# Patient Record
Sex: Female | Born: 1985 | Race: White | Hispanic: No | Smoking: Current every day smoker
Health system: Southern US, Community
[De-identification: ages and names within clinical notes are randomized; demographics above are authoritative.]

## PROBLEM LIST (undated history)

## (undated) HISTORY — PX: TUBAL LIGATION: SHX77

## (undated) HISTORY — PX: MULTIPLE TOOTH EXTRACTIONS: SHX2053

---

## 2013-08-16 DIAGNOSIS — O159 Eclampsia, unspecified as to time period: Secondary | ICD-10-CM

## 2013-08-16 HISTORY — DX: Eclampsia, unspecified as to time period: O15.9

## 2017-08-29 ENCOUNTER — Other Ambulatory Visit: Payer: Self-pay | Admitting: Pain Medicine

## 2017-08-29 DIAGNOSIS — M542 Cervicalgia: Secondary | ICD-10-CM

## 2017-09-05 ENCOUNTER — Other Ambulatory Visit: Payer: Self-pay

## 2017-09-07 ENCOUNTER — Ambulatory Visit
Admission: RE | Admit: 2017-09-07 | Discharge: 2017-09-07 | Disposition: A | Discharge status: Home / Self Care | Payer: Medicare Other | Source: Ambulatory Visit | Attending: Pain Medicine | Admitting: Pain Medicine

## 2017-09-07 DIAGNOSIS — M542 Cervicalgia: Secondary | ICD-10-CM

## 2019-12-01 ENCOUNTER — Other Ambulatory Visit: Payer: Self-pay | Admitting: Physician Assistant

## 2019-12-01 DIAGNOSIS — M4722 Other spondylosis with radiculopathy, cervical region: Secondary | ICD-10-CM

## 2020-02-13 ENCOUNTER — Other Ambulatory Visit: Payer: Medicare Other

## 2020-03-06 ENCOUNTER — Other Ambulatory Visit: Payer: Medicare Other

## 2020-09-29 ENCOUNTER — Ambulatory Visit: Payer: Medicare Other | Attending: Neurosurgery | Admitting: Physical Therapy

## 2020-10-14 ENCOUNTER — Other Ambulatory Visit: Payer: Self-pay | Admitting: Neurosurgery

## 2020-11-03 NOTE — Progress Notes (Signed)
Surgical Instructions    Your procedure is scheduled on May 24  Report to [facility] Main Entrance "A" at 0530 A.M., then check in with the Admitting office.  Call this number if you have problems the morning of surgery:  [phone]   If you have any questions prior to your surgery date call [phone]: Open Monday-Friday 8am-4pm    Remember:  Do not eat or drink after midnight the night before your surgery    Take these medicines the morning of surgery with A SIP OF WATER  albuterol (VENTOLIN HFA)  If needed, Please bring all inhalers with you the day of surgery.  gabapentin (NEURONTIN)  mirtazapine (REMERON) oxyCODONE (OXY IR/ROXICODONE) if needed  venlafaxine XR (EFFEXOR-XR)   As of today, STOP taking any Aspirin (unless otherwise instructed by your surgeon) Aleve, Naproxen, Ibuprofen, Motrin, Advil, Goody's, BC's, all herbal medications, fish oil, and all vitamins.                     Do not wear jewelry, make up, or nail polish            Do not wear lotions, powders, perfumes/colognes, or deodorant.            Do not shave 48 hours prior to surgery.             Do not bring valuables to the hospital.            [facility] is not responsible for any belongings or valuables.  Do NOT Smoke (Tobacco/Vaping) or drink Alcohol 24 hours prior to your procedure If you use a CPAP at night, you may bring all equipment for your overnight stay.   Contacts, glasses, dentures or bridgework may not be worn into surgery, please bring cases for these belongings   For patients admitted to the hospital, discharge time will be determined by your treatment team.   Patients discharged the day of surgery will not be allowed to drive home, and someone needs to stay with them for 24 hours.    Special instructions:    Oral Hygiene is also important to reduce your risk of infection.  Remember - BRUSH YOUR TEETH THE MORNING OF SURGERY WITH YOUR REGULAR TOOTHPASTE   [place]- Preparing  For Surgery  Before surgery, you can play an important role. Because skin is not sterile, your skin needs to be as free of germs as possible. You can reduce the number of germs on your skin by washing with CHG (chlorahexidine gluconate) Soap before surgery.  CHG is an antiseptic cleaner which kills germs and bonds with the skin to continue killing germs even after washing.     Please do not use if you have an allergy to CHG or antibacterial soaps. If your skin becomes reddened/irritated stop using the CHG.  Do not shave (including legs and underarms) for at least 48 hours prior to first CHG shower. It is OK to shave your face.  Please follow these instructions carefully.    1.  Shower the NIGHT BEFORE SURGERY and the MORNING OF SURGERY with CHG Soap.   If you chose to wash your hair, wash your hair first as usual with your normal shampoo. After you shampoo, rinse your hair and body thoroughly to remove the shampoo.  Then [employer] and genitals (private parts) with your normal soap and rinse thoroughly to remove soap.  2. After that Use CHG Soap as you would any other liquid soap. You  can apply CHG directly to the skin and wash gently with a scrungie or a clean washcloth.   3. Apply the CHG Soap to your body ONLY FROM THE NECK DOWN.  Do not use on open wounds or open sores. Avoid contact with your eyes, ears, mouth and genitals (private parts). Wash Face and genitals (private parts)  with your normal soap.   4. Wash thoroughly, paying special attention to the area where your surgery will be performed.  5. Thoroughly rinse your body with warm water from the neck down.  6. DO NOT shower/wash with your normal soap after using and rinsing off the CHG Soap.  7. Pat yourself dry with a CLEAN TOWEL.  8. Wear CLEAN PAJAMAS to bed the night before surgery  9. Place CLEAN SHEETS on your bed the night before your surgery  10. DO NOT SLEEP WITH PETS.   Day of Surgery: Take a shower with CHG  soap. Wear Clean/Comfortable clothing the morning of surgery Do not apply any deodorants/lotions.   Remember to brush your teeth WITH YOUR REGULAR TOOTHPASTE.   Please read over the following fact sheets that you were given.

## 2020-11-04 ENCOUNTER — Encounter (HOSPITAL_COMMUNITY): Payer: Self-pay

## 2020-11-04 ENCOUNTER — Other Ambulatory Visit: Payer: Self-pay

## 2020-11-04 ENCOUNTER — Encounter (HOSPITAL_COMMUNITY)
Admission: RE | Admit: 2020-11-04 | Discharge: 2020-11-04 | Disposition: A | Discharge status: Home / Self Care | Payer: Medicare Other | Source: Ambulatory Visit | Attending: Neurosurgery | Admitting: Neurosurgery

## 2020-11-04 DIAGNOSIS — Z01812 Encounter for preprocedural laboratory examination: Secondary | ICD-10-CM | POA: Diagnosis not present

## 2020-11-04 DIAGNOSIS — Z79899 Other long term (current) drug therapy: Secondary | ICD-10-CM | POA: Diagnosis not present

## 2020-11-04 DIAGNOSIS — M5412 Radiculopathy, cervical region: Secondary | ICD-10-CM | POA: Diagnosis not present

## 2020-11-04 DIAGNOSIS — Z8616 Personal history of COVID-19: Secondary | ICD-10-CM | POA: Insufficient documentation

## 2020-11-04 DIAGNOSIS — Z20822 Contact with and (suspected) exposure to covid-19: Secondary | ICD-10-CM | POA: Diagnosis not present

## 2020-11-04 LAB — CBC
HCT: 37.8 % (ref 36.0–46.0)
Hemoglobin: 12.6 g/dL (ref 12.0–15.0)
MCH: 30.9 pg (ref 26.0–34.0)
MCHC: 33.3 g/dL (ref 30.0–36.0)
MCV: 92.6 fL (ref 80.0–100.0)
Platelets: 346 10*3/uL (ref 150–400)
RBC: 4.08 MIL/uL (ref 3.87–5.11)
RDW: 13.5 % (ref 11.5–15.5)
WBC: 10.2 10*3/uL (ref 4.0–10.5)
nRBC: 0 % (ref 0.0–0.2)

## 2020-11-04 LAB — SURGICAL PCR SCREEN
MRSA, PCR: NEGATIVE
Staphylococcus aureus: POSITIVE — AB

## 2020-11-04 LAB — SARS CORONAVIRUS 2 (TAT 6-24 HRS): SARS Coronavirus 2: NEGATIVE

## 2020-11-04 NOTE — Progress Notes (Signed)
PCP - [facility] Cardiologist - Dr Virgil [facility]   Chest x-ray - denies EKG - denies Stress Test - denies ECHO - denies Cardiac Cath - denies  COVID TEST- pending   Anesthesia review: yes records requested [facility]  Patient denies shortness of breath, fever, cough and chest pain at PAT appointment   All instructions explained to the patient, with a verbal understanding of the material. Patient agrees to go over the instructions while at home for a better understanding. Patient also instructed to self quarantine after being tested for COVID-19. The opportunity to ask questions was provided.

## 2020-11-07 ENCOUNTER — Encounter (HOSPITAL_COMMUNITY): Payer: Self-pay

## 2020-11-07 NOTE — Anesthesia Preprocedure Evaluation (Addendum)
Anesthesia Evaluation  Patient identified by MRN, date of birth, ID band Patient awake    Reviewed: Allergy & Precautions, NPO status , Patient's Chart, lab work & pertinent test results  History of Anesthesia Complications Negative for: history of anesthetic complications  Airway Mallampati: II  TM Distance: >3 FB Neck ROM: Full    Dental  (+) Missing, Poor Dentition, Dental Advisory Given   Pulmonary Current Smoker,    Pulmonary exam normal        Cardiovascular negative cardio ROS Normal cardiovascular exam     Neuro/Psych negative neurological ROS     GI/Hepatic negative GI ROS, Neg liver ROS,   Endo/Other  negative endocrine ROS  Renal/GU negative Renal ROS  negative genitourinary   Musculoskeletal negative musculoskeletal ROS (+)   Abdominal   Peds  Hematology negative hematology ROS (+)   Anesthesia Other Findings   Reproductive/Obstetrics negative OB ROS                           Anesthesia Physical Anesthesia Plan  ASA: II  Anesthesia Plan: General   Post-op Pain Management:    Induction: Intravenous  PONV Risk Score and Plan: 2 and Ondansetron, Dexamethasone, Treatment may vary due to age or medical condition and Midazolam  Airway Management Planned: Oral ETT  Additional Equipment: None  Intra-op Plan:   Post-operative Plan: Extubation in OR  Informed Consent: I have reviewed the patients History and Physical, chart, labs and discussed the procedure including the risks, benefits and alternatives for the proposed anesthesia with the patient or authorized representative who has indicated his/her understanding and acceptance.     Dental advisory given  Plan Discussed with:   Anesthesia Plan Comments: (PAT note written 11/07/2020 by Shonna Chock, PA-C. )       Anesthesia Quick Evaluation

## 2020-11-07 NOTE — Progress Notes (Signed)
Anesthesia Chart Review:  Case: [id number] Date/Time: 11/08/20 0715   Procedure: Artificial Disc Replacement-Cervical - C5-C6 (N/A ) - 3C   Anesthesia type: General   Pre-op diagnosis: Radiculopathy cervical region   Location: [facility] OR ROOM 20 / [facility] OR   Surgeons: Bedelia Person, MD      DISCUSSION: Patient is a 35 year old female scheduled for the above procedure.  History includes smoking, tubal ligation, teeth extractions. COVID-19 05/03/20 (by notes).   She was evaluated by cardiologist Dr. Martha Clan in later 2021 for chest pain. He also notes that during her next to last pregnancy she had "a fairly complex course developing preeclampsia, seizures and a stroke.  She was also noted to have had ongoing bouts of bradycardia." (Review of [facility] Everywhere Discharge Summary from 08/21/13 indicated she had postpartum eclampsia and PRES/posterior reversible encephalopathy syndrome, so according to 01/17/15 Discharge Summary she was admitted for seizure prophylaxis with MgSO4 IV given piror history of eclampsia and noted to have bradycardia with HR 37 and post-partum pre-eclampsia, cardiology consulted and "thought to be reflex in response to HTN" with unremarkable EKG and Echo). She had a reassuring stress test and echo in 01/2020. He also repeated an echo in 05/2020 post-COVID--EF normal, no significant valvular abnormalities, although AV not well visualized. Last visit 12/8/21wtih six month follow-up planned.  11/04/20 preoperative COVID-19 test negative. Anesthesia team to evaluate on the day of surgery.    VS: BP 109/76   Pulse 71   Temp 36.9 C (Oral)   Resp 20   Ht 5\' 2"  (1.575 m)   Wt 61 kg   LMP 10/26/2020   SpO2 100%   BMI 24.58 kg/m     PROVIDERS: 12/26/2020, AGNP is PCP [facility]) [facility], MD is cardiologist [facility])   LABS: Labs reviewed: Acceptable for surgery. (all labs ordered are listed, but only abnormal  results are displayed)  Labs Reviewed  SURGICAL PCR SCREEN - Abnormal; Notable for the following components:      Result Value   Staphylococcus aureus POSITIVE (*)    All other components within normal limits  SARS CORONAVIRUS 2 (TAT 6-24 HRS)  CBC    EKG: 05/18/20 [facility]): Sinus rhythm   CV: Echo 05/25/20 [facility]): Conclusions: 1.  Sinus rhythm. 2.  This was a technically difficult study with suboptimal views. 3.  The left ventricular wall thickness is normal. 4.  Overall left ventricular systolic function is normal with an EF between 60 and 65%. 5.  The diastolic filling pattern is normal for age of the patient. 6.  The right atrium is normal size and function. 7.  The aortic valve is not well visualized. A.  Normal appearing mitral valve with normal valve function. 9.  The tricuspid valve appears structurally normal with normal function. 10.  Trace tricuspid regurgitation present. 11.  Right ventricular systolic pressure is normal at 22 mmHg. 12.  The aortic root, ascending aorta and aortic arch are normal. - Comparison echo 01/19/20 as outlined by Dr. 03/20/20 in 05/25/20 office note: Echo on 01/19/20 showed: "o significant pathology with ejection fraction of 60 to 65% with normal diastolic function normal cardiac valve structure and function"   Nuclear stress test 02/15/20 [facility]): Impression: 1.  Normal ECG with no exercise-induced findings for ischemia. 2.  [facility] treadmill score +9 suggesting low likelihood of ischemia. 3.  Good functional capacity achieving 10.1 METS. 4.  Normal systolic function with EF of  61%. 5.  Nuclear imaging showed no evidence of fixed or reversible defects. 6.  Study suggest low probability of significant flow-limiting coronary artery disease.   Past Medical History:  Diagnosis Date  . Eclampsia (toxemia of pregnancy) 08/2013    Past Surgical History:  Procedure Laterality Date  . MULTIPLE TOOTH EXTRACTIONS    . TUBAL  LIGATION      MEDICATIONS: . albuterol (VENTOLIN HFA) 108 (90 Base) MCG/ACT inhaler  . Biotin w/ Vitamins C & E (HAIR/SKIN/NAILS PO)  . gabapentin (NEURONTIN) 300 MG capsule  . mirtazapine (REMERON) 15 MG tablet  . ondansetron (ZOFRAN) 8 MG tablet  . oxyCODONE (OXY IR/ROXICODONE) 5 MG immediate release tablet  . venlafaxine XR (EFFEXOR-XR) 37.5 MG 24 hr capsule   No current facility-administered medications for this encounter.    Shonna Chock, PA-C Surgical Short Stay/Anesthesiology [facility] Phone [phone] [facility] Phone [phone] 11/07/2020 2:17 PM

## 2020-11-08 ENCOUNTER — Ambulatory Visit (HOSPITAL_COMMUNITY): Payer: Medicare Other | Admitting: Certified Registered"

## 2020-11-08 ENCOUNTER — Encounter (HOSPITAL_COMMUNITY)
Admission: RE | Disposition: A | Discharge status: Home / Self Care | Payer: Self-pay | Source: Home / Self Care | Attending: Neurosurgery

## 2020-11-08 ENCOUNTER — Ambulatory Visit (HOSPITAL_COMMUNITY): Payer: Medicare Other

## 2020-11-08 ENCOUNTER — Other Ambulatory Visit: Payer: Self-pay

## 2020-11-08 ENCOUNTER — Encounter (HOSPITAL_COMMUNITY): Payer: Self-pay

## 2020-11-08 ENCOUNTER — Ambulatory Visit (HOSPITAL_COMMUNITY)
Admission: RE | Admit: 2020-11-08 | Discharge: 2020-11-08 | Disposition: A | Discharge status: Home / Self Care | Payer: Medicare Other | Attending: Neurosurgery | Admitting: Neurosurgery

## 2020-11-08 ENCOUNTER — Ambulatory Visit (HOSPITAL_COMMUNITY): Payer: Medicare Other | Admitting: Vascular Surgery

## 2020-11-08 DIAGNOSIS — F17210 Nicotine dependence, cigarettes, uncomplicated: Secondary | ICD-10-CM | POA: Insufficient documentation

## 2020-11-08 DIAGNOSIS — M50122 Cervical disc disorder at C5-C6 level with radiculopathy: Secondary | ICD-10-CM | POA: Insufficient documentation

## 2020-11-08 DIAGNOSIS — Z882 Allergy status to sulfonamides status: Secondary | ICD-10-CM | POA: Insufficient documentation

## 2020-11-08 DIAGNOSIS — Z79899 Other long term (current) drug therapy: Secondary | ICD-10-CM | POA: Insufficient documentation

## 2020-11-08 DIAGNOSIS — Z9889 Other specified postprocedural states: Secondary | ICD-10-CM | POA: Diagnosis present

## 2020-11-08 DIAGNOSIS — Z419 Encounter for procedure for purposes other than remedying health state, unspecified: Secondary | ICD-10-CM

## 2020-11-08 HISTORY — PX: CERVICAL DISC ARTHROPLASTY: SHX587

## 2020-11-08 LAB — POCT PREGNANCY, URINE: Preg Test, Ur: NEGATIVE

## 2020-11-08 SURGERY — CERVICAL ANTERIOR DISC ARTHROPLASTY
Anesthesia: General

## 2020-11-08 MED ORDER — PROPOFOL 10 MG/ML IV BOLUS
INTRAVENOUS | Status: AC
  Filled 2020-11-08: qty 20

## 2020-11-08 MED ORDER — MIDAZOLAM HCL 2 MG/2ML IJ SOLN
INTRAMUSCULAR | Status: DC | PRN
  Administered 2020-11-08: 2 mg via INTRAVENOUS

## 2020-11-08 MED ORDER — ROCURONIUM BROMIDE 10 MG/ML (PF) SYRINGE
PREFILLED_SYRINGE | INTRAVENOUS | Status: AC
  Filled 2020-11-08: qty 10

## 2020-11-08 MED ORDER — ORAL CARE MOUTH RINSE: 15.0000 mL | Freq: Once | OROMUCOSAL | Status: AC

## 2020-11-08 MED ORDER — LACTATED RINGERS IV SOLN: INTRAVENOUS | Status: DC

## 2020-11-08 MED ORDER — CHLORHEXIDINE GLUCONATE CLOTH 2 % EX PADS: 6.0000 | MEDICATED_PAD | Freq: Once | CUTANEOUS | Status: DC

## 2020-11-08 MED ORDER — SUGAMMADEX SODIUM 200 MG/2ML IV SOLN
INTRAVENOUS | Status: DC | PRN
  Administered 2020-11-08: 200 mg via INTRAVENOUS

## 2020-11-08 MED ORDER — LIDOCAINE 2% (20 MG/ML) 5 ML SYRINGE
INTRAMUSCULAR | Status: AC
  Filled 2020-11-08: qty 5

## 2020-11-08 MED ORDER — OXYCODONE HCL 5 MG PO TABS
5.0000 mg | ORAL_TABLET | Freq: Four times a day (QID) | ORAL | Status: DC | PRN
  Administered 2020-11-08: 5 mg via ORAL
  Filled 2020-11-08: qty 1

## 2020-11-08 MED ORDER — DEXAMETHASONE SODIUM PHOSPHATE 10 MG/ML IJ SOLN
INTRAMUSCULAR | Status: DC | PRN
  Administered 2020-11-08: 10 mg via INTRAVENOUS

## 2020-11-08 MED ORDER — EPHEDRINE SULFATE-NACL 50-0.9 MG/10ML-% IV SOSY
PREFILLED_SYRINGE | INTRAVENOUS | Status: DC | PRN
Start: 2020-11-08 — End: 2020-11-08
  Administered 2020-11-08: 10 mg via INTRAVENOUS

## 2020-11-08 MED ORDER — FENTANYL CITRATE (PF) 250 MCG/5ML IJ SOLN
INTRAMUSCULAR | Status: DC | PRN
  Administered 2020-11-08 (×3): 50 ug via INTRAVENOUS
  Administered 2020-11-08: 100 ug via INTRAVENOUS
  Administered 2020-11-08 (×2): 50 ug via INTRAVENOUS

## 2020-11-08 MED ORDER — ONDANSETRON HCL 4 MG/2ML IJ SOLN
INTRAMUSCULAR | Status: AC
  Filled 2020-11-08: qty 2

## 2020-11-08 MED ORDER — LIDOCAINE-EPINEPHRINE 1 %-1:100000 IJ SOLN
INTRAMUSCULAR | Status: DC | PRN
  Administered 2020-11-08: 2 mL

## 2020-11-08 MED ORDER — EPHEDRINE 5 MG/ML INJ
INTRAVENOUS | Status: AC
  Filled 2020-11-08: qty 10

## 2020-11-08 MED ORDER — ROCURONIUM BROMIDE 10 MG/ML (PF) SYRINGE
PREFILLED_SYRINGE | INTRAVENOUS | Status: DC | PRN
  Administered 2020-11-08 (×2): 50 mg via INTRAVENOUS

## 2020-11-08 MED ORDER — THROMBIN 5000 UNITS EX SOLR
CUTANEOUS | Status: AC
  Filled 2020-11-08: qty 5000

## 2020-11-08 MED ORDER — MORPHINE SULFATE (PF) 2 MG/ML IV SOLN: 2.0000 mg | INTRAVENOUS | Status: DC | PRN

## 2020-11-08 MED ORDER — ONDANSETRON HCL 4 MG PO TABS
4.0000 mg | ORAL_TABLET | Freq: Once | ORAL | Status: AC
  Administered 2020-11-08: 4 mg via ORAL
  Filled 2020-11-08: qty 1

## 2020-11-08 MED ORDER — CHLORHEXIDINE GLUCONATE 0.12 % MT SOLN
15.0000 mL | Freq: Once | OROMUCOSAL | Status: AC
  Administered 2020-11-08: 15 mL via OROMUCOSAL
  Filled 2020-11-08: qty 15

## 2020-11-08 MED ORDER — LIDOCAINE-EPINEPHRINE 1 %-1:100000 IJ SOLN
INTRAMUSCULAR | Status: AC
  Filled 2020-11-08: qty 1

## 2020-11-08 MED ORDER — MIDAZOLAM HCL 2 MG/2ML IJ SOLN
INTRAMUSCULAR | Status: AC
  Filled 2020-11-08: qty 2

## 2020-11-08 MED ORDER — LIDOCAINE 2% (20 MG/ML) 5 ML SYRINGE
INTRAMUSCULAR | Status: DC | PRN
  Administered 2020-11-08: 60 mg via INTRAVENOUS

## 2020-11-08 MED ORDER — THROMBIN 5000 UNITS EX SOLR
OROMUCOSAL | Status: DC | PRN
  Administered 2020-11-08: 5 mL via TOPICAL

## 2020-11-08 MED ORDER — PROPOFOL 10 MG/ML IV BOLUS
INTRAVENOUS | Status: DC | PRN
  Administered 2020-11-08: 150 mg via INTRAVENOUS

## 2020-11-08 MED ORDER — DEXMEDETOMIDINE (PRECEDEX) IN NS 20 MCG/5ML (4 MCG/ML) IV SYRINGE
PREFILLED_SYRINGE | INTRAVENOUS | Status: DC | PRN
  Administered 2020-11-08: 20 ug via INTRAVENOUS

## 2020-11-08 MED ORDER — FENTANYL CITRATE (PF) 250 MCG/5ML IJ SOLN
INTRAMUSCULAR | Status: AC
  Filled 2020-11-08: qty 5

## 2020-11-08 MED ORDER — DEXMEDETOMIDINE (PRECEDEX) IN NS 20 MCG/5ML (4 MCG/ML) IV SYRINGE
PREFILLED_SYRINGE | INTRAVENOUS | Status: AC
  Filled 2020-11-08: qty 5

## 2020-11-08 MED ORDER — ONDANSETRON HCL 4 MG/2ML IJ SOLN
INTRAMUSCULAR | Status: DC | PRN
  Administered 2020-11-08: 4 mg via INTRAVENOUS

## 2020-11-08 MED ORDER — 0.9 % SODIUM CHLORIDE (POUR BTL) OPTIME
TOPICAL | Status: DC | PRN
  Administered 2020-11-08: 1000 mL

## 2020-11-08 MED ORDER — CEFAZOLIN SODIUM-DEXTROSE 2-4 GM/100ML-% IV SOLN
2.0000 g | INTRAVENOUS | Status: AC
  Administered 2020-11-08: 2 g via INTRAVENOUS
  Filled 2020-11-08: qty 100

## 2020-11-08 SURGICAL SUPPLY — 55 items
BAND RUBBER #18 3X1/16 STRL (MISCELLANEOUS) ×6 IMPLANT
BENZOIN TINCTURE PRP APPL 2/3 (GAUZE/BANDAGES/DRESSINGS) ×3 IMPLANT
BIT DRILL NEURO 2X3.1 SFT TUCH (MISCELLANEOUS) ×1 IMPLANT
BLADE CLIPPER SURG (BLADE) IMPLANT
BLADE ULTRA TIP 2M (BLADE) IMPLANT
BUR MATCHSTICK NEURO 3.0 LAGG (BURR) ×3 IMPLANT
CANISTER SUCT 3000ML PPV (MISCELLANEOUS) ×3 IMPLANT
CLOSURE WOUND 1/2 X4 (GAUZE/BANDAGES/DRESSINGS) ×1
COLLAR CERV LO CONTOUR FIRM DE (SOFTGOODS) ×3 IMPLANT
COVER WAND RF STERILE (DRAPES) IMPLANT
DECANTER SPIKE VIAL GLASS SM (MISCELLANEOUS) ×3 IMPLANT
DISC MOBI-C CERVICAL 13X15 H5 (Miscellaneous) ×3 IMPLANT
DRAPE C-ARM 42X72 X-RAY (DRAPES) ×3 IMPLANT
DRAPE C-ARMOR (DRAPES) ×3 IMPLANT
DRAPE LAPAROTOMY 100X72 PEDS (DRAPES) ×3 IMPLANT
DRAPE MICROSCOPE LEICA (MISCELLANEOUS) ×3 IMPLANT
DRAPE SHEET LG 3/4 BI-LAMINATE (DRAPES) ×3 IMPLANT
DRILL NEURO 2X3.1 SOFT TOUCH (MISCELLANEOUS) ×3
DRSG OPSITE 4X5.5 SM (GAUZE/BANDAGES/DRESSINGS) ×6 IMPLANT
DRSG OPSITE POSTOP 3X4 (GAUZE/BANDAGES/DRESSINGS) ×3 IMPLANT
DURAPREP 26ML APPLICATOR (WOUND CARE) ×3 IMPLANT
DURAPREP 6ML APPLICATOR 50/CS (WOUND CARE) ×3 IMPLANT
ELECT COATED BLADE 2.86 ST (ELECTRODE) ×3 IMPLANT
ELECT REM PT RETURN 9FT ADLT (ELECTROSURGICAL) ×3
ELECTRODE REM PT RTRN 9FT ADLT (ELECTROSURGICAL) ×1 IMPLANT
GAUZE 4X4 16PLY RFD (DISPOSABLE) IMPLANT
GLOVE BIOGEL PI IND STRL 7.5 (GLOVE) ×1 IMPLANT
GLOVE BIOGEL PI INDICATOR 7.5 (GLOVE) ×2
GLOVE ECLIPSE 7.5 STRL STRAW (GLOVE) ×3 IMPLANT
GOWN STRL REUS W/ TWL LRG LVL3 (GOWN DISPOSABLE) ×2 IMPLANT
GOWN STRL REUS W/ TWL XL LVL3 (GOWN DISPOSABLE) ×1 IMPLANT
GOWN STRL REUS W/TWL 2XL LVL3 (GOWN DISPOSABLE) IMPLANT
GOWN STRL REUS W/TWL LRG LVL3 (GOWN DISPOSABLE) ×4
GOWN STRL REUS W/TWL XL LVL3 (GOWN DISPOSABLE) ×2
HEMOSTAT POWDER KIT SURGIFOAM (HEMOSTASIS) ×3 IMPLANT
KIT BASIN OR (CUSTOM PROCEDURE TRAY) ×3 IMPLANT
KIT TURNOVER KIT B (KITS) ×3 IMPLANT
NEEDLE HYPO 22GX1.5 SAFETY (NEEDLE) ×3 IMPLANT
NEEDLE SPNL 22GX3.5 QUINCKE BK (NEEDLE) ×3 IMPLANT
NS IRRIG 1000ML POUR BTL (IV SOLUTION) ×3 IMPLANT
PACK LAMINECTOMY NEURO (CUSTOM PROCEDURE TRAY) ×3 IMPLANT
PAD ARMBOARD 7.5X6 YLW CONV (MISCELLANEOUS) ×9 IMPLANT
PATTIES SURGICAL .5 X3 (DISPOSABLE) ×3 IMPLANT
PIN DISTRACTION 14MM (PIN) ×6 IMPLANT
SPONGE INTESTINAL PEANUT (DISPOSABLE) ×3 IMPLANT
SPONGE SURGIFOAM ABS GEL SZ50 (HEMOSTASIS) IMPLANT
STAPLER VISISTAT 35W (STAPLE) IMPLANT
STRIP CLOSURE SKIN 1/2X4 (GAUZE/BANDAGES/DRESSINGS) ×2 IMPLANT
SUT MNCRL AB 4-0 PS2 18 (SUTURE) ×3 IMPLANT
SUT SILK 2 0 TIES 10X30 (SUTURE) IMPLANT
SUT VIC AB 3-0 SH 8-18 (SUTURE) ×3 IMPLANT
TAPE CLOTH 3X10 TAN LF (GAUZE/BANDAGES/DRESSINGS) ×3 IMPLANT
TOWEL GREEN STERILE (TOWEL DISPOSABLE) ×3 IMPLANT
TOWEL GREEN STERILE FF (TOWEL DISPOSABLE) ×3 IMPLANT
WATER STERILE IRR 1000ML POUR (IV SOLUTION) ×3 IMPLANT

## 2020-11-08 NOTE — Anesthesia Postprocedure Evaluation (Signed)
Anesthesia Post Note  Patient: Hannah Reyes  Procedure(s) Performed: Artificial Disc Replacement-Cervical - Cervical Five-Cervical Six (N/A )     Patient location during evaluation: PACU Anesthesia Type: General Level of consciousness: awake and alert Pain management: pain level controlled Vital Signs Assessment: post-procedure vital signs reviewed and stable Respiratory status: spontaneous breathing, nonlabored ventilation and respiratory function stable Cardiovascular status: blood pressure returned to baseline and stable Postop Assessment: no apparent nausea or vomiting Anesthetic complications: no   No complications documented.  Last Vitals:  Vitals:   11/08/20 1115 11/08/20 1139  BP: 101/73 109/70  Pulse: (!) 56 (!) 51  Resp: 15 20  Temp: (!) 36.3 C 36.4 C  SpO2: 97% 100%    Last Pain:  Vitals:   11/08/20 1139  TempSrc: Oral  PainSc:     LLE Motor Response: Purposeful movement (11/08/20 1150) LLE Sensation: Full sensation (11/08/20 1150) RLE Motor Response: Purposeful movement (11/08/20 1150) RLE Sensation: Full sensation (11/08/20 1150)      Lucretia Kern

## 2020-11-08 NOTE — Anesthesia Procedure Notes (Signed)
Procedure Name: Intubation Date/Time: 11/08/2020 8:01 AM Performed by: Rosiland Oz, CRNA Pre-anesthesia Checklist: Patient identified, Emergency Drugs available, Patient being monitored and Suction available Patient Re-evaluated:Patient Re-evaluated prior to induction Oxygen Delivery Method: Circle system utilized Preoxygenation: Pre-oxygenation with 100% oxygen Induction Type: IV induction Ventilation: Mask ventilation without difficulty Laryngoscope Size: Miller and 3 Grade View: Grade I Tube type: Oral Tube size: 7.0 mm Number of attempts: 1 Airway Equipment and Method: Stylet Placement Confirmation: ETT inserted through vocal cords under direct vision,  positive ETCO2 and breath sounds checked- equal and bilateral Secured at: 21 cm Tube secured with: Tape Dental Injury: Teeth and Oropharynx as per pre-operative assessment

## 2020-11-08 NOTE — Plan of Care (Signed)
Adequately Ready For Discharge 

## 2020-11-08 NOTE — Progress Notes (Signed)
Patient alert and oriented, voiding adequately, MAE well with no difficulty. Incision area cdi with no s/s of infection. Patient discharged home per order. Patient and husband stated understanding of discharge instructions given. Patient has an appointment with Dr. Maisie Fus in 2 weeks

## 2020-11-08 NOTE — Transfer of Care (Signed)
Immediate Anesthesia Transfer of Care Note  Patient: Hannah Reyes  Procedure(s) Performed: Artificial Disc Replacement-Cervical - Cervical Five-Cervical Six (N/A )  Patient Location: PACU  Anesthesia Type:General  Level of Consciousness: drowsy and patient cooperative  Airway & Oxygen Therapy: Patient Spontanous Breathing  Post-op Assessment: Report given to RN and Post -op Vital signs reviewed and stable  Post vital signs: Reviewed and stable  Last Vitals:  Vitals Value Taken Time  BP 114/65 11/08/20 1000  Temp    Pulse 97 11/08/20 1001  Resp 24 11/08/20 1001  SpO2 93 % 11/08/20 1001  Vitals shown include unvalidated device data.  Last Pain:  Vitals:   11/08/20 0621  TempSrc: Oral         Complications: No complications documented.

## 2020-11-08 NOTE — H&P (Signed)
CC: cervical radiculopathy  HPI:     Patient is a 35 y.o. female presented with pain in her neck going down her left arm and into her shoulders, and had weakness in her left arm and hand.  Nonsurgical therapies failed to improve her symptoms.   There are no problems to display for this patient.  Past Medical History:  Diagnosis Date  . Eclampsia (toxemia of pregnancy) 08/2013    Past Surgical History:  Procedure Laterality Date  . MULTIPLE TOOTH EXTRACTIONS    . TUBAL LIGATION      Medications Prior to Admission  Medication Sig Dispense Refill Last Dose  . albuterol (VENTOLIN HFA) 108 (90 Base) MCG/ACT inhaler Inhale 2 puffs into the lungs 4 (four) times daily as needed for wheezing or shortness of breath.   Past Month at Unknown time  . Biotin w/ Vitamins C & E (HAIR/SKIN/NAILS PO) Take 1 capsule by mouth daily.   Past Week at Unknown time  . gabapentin (NEURONTIN) 300 MG capsule Take 600 mg by mouth in the morning, at noon, and at bedtime.   11/07/2020 at Unknown time  . mirtazapine (REMERON) 15 MG tablet Take 15 mg by mouth daily.   11/07/2020 at Unknown time  . ondansetron (ZOFRAN) 8 MG tablet Take 8 mg by mouth every 8 (eight) hours as needed.   11/07/2020 at Unknown time  . oxyCODONE (OXY IR/ROXICODONE) 5 MG immediate release tablet Take 5 mg by mouth every 6 (six) hours as needed for pain.   Past Week at Unknown time  . venlafaxine XR (EFFEXOR-XR) 37.5 MG 24 hr capsule Take 37.5 mg by mouth daily.   11/07/2020 at Unknown time   Allergies  Allergen Reactions  . Sulfa Antibiotics Hives and Rash    Social History   Tobacco Use  . Smoking status: Current Every Day Smoker    Packs/day: 0.50    Types: Cigarettes  . Smokeless tobacco: Never Used  Substance Use Topics  . Alcohol use: Never    History reviewed. No pertinent family history.   Review of Systems Pertinent items noted in HPI and remainder of comprehensive ROS otherwise negative.  Objective:   Patient Vitals  for the past 8 hrs:  BP Temp Temp src Pulse Resp SpO2 Height Weight  11/08/20 0621 105/69 98.3 F (36.8 C) Oral 65 18 96 % 5\' 2"  (1.575 m) 61 kg   No intake/output data recorded. No intake/output data recorded.      General : Alert, cooperative, no distress, appears stated age   Head:  Normocephalic/atraumatic    Eyes: PERRL, conjunctiva/corneas clear, EOM's intact. Fundi could not be visualized Neck: Supple Chest:  Respirations unlabored Chest wall: no tenderness or deformity Heart: Regular rate and rhythm Abdomen: Soft, nontender and nondistended Extremities: warm and well-perfused Skin: normal turgor, color and texture Neurologic:  Alert, oriented x 3.  Eyes open spontaneously. PERRL, EOMI, VFC, no facial droop. V1-3 intact.  No dysarthria, tongue protrusion symmetric.  CNII-XII intact. Normal strength, sensation and reflexes throughout.  No pronator drift, full strength in legs. Positive spurling.  L biceps, triceps 4/5, WExt 4+/5       Data ReviewRadiology review:   C5-6 disc/osteophyte results in moderate/severe stenosis  Assessment:  Cervical radiculopathy  Plan:   - plan for C5-6 disc arthroplasty - risks, benefits, alternatives and expected convalescence discussed.

## 2020-11-08 NOTE — Op Note (Signed)
PREOP DIAGNOSIS: Cervical radiculopathy and disc degeneration  POSTOP DIAGNOSIS: Cervical radiculopathy and disc degeneration   PROCEDURE: 1. C5-6 Total disc arthroplasty, including discectomy for decompression of spinal cord and exiting nerve roots with foraminotomies using Mobi-C artificial disc 2. Use of intraoperative microscope  SURGEON: Dr. Hoyt Koch, MD  ASSISTANT: none  ANESTHESIA: General Endotracheal  EBL: 25 ml  IMPLANTS: [employer] Mobi C 13 x 15 x 5 mm  SPECIMENS: None  DRAINS: None  COMPLICATIONS: None immediate  CONDITION: Hemodynamically stable to PACU  HISTORY: Hannah Reyes is a 35 y.o. y.o. female who presented with worsening neck pain and left arm pain with left arm and hand weakness.  She was found to have significant nerve impingement related to the C5-6 disc degeneration.  She failed nonsurgical measures and her symptoms are worsening.  Given her worsening symptoms with weakness, I recommended addressing her radiculopathy with surgery via disc arthroplasty.  Risks, benefits, alternatives, and expected convalescence were discussed with the patient.  Risks discussed included but were not limited to bleeding, pain, infection, loss of mobility, hardware failure, adjacent segment disease, CSF leak, neurologic deficits, weakness, numbness, paralysis, coma, and death. After all questions were answered, informed consent was obtained.  PROCEDURE IN DETAIL: The patient was brought to the operating room and transferred to the operative table. After induction of general anesthesia, the patient was positioned on the operative table in the supine position with all pressure points meticulously padded. The skin of the neck was then prepped and draped in the usual sterile fashion.  After timeout was conducted, the skin was infiltrated with local anesthetic. Skin incision was then made sharply and Bovie electrocautery was used to dissect the subcutaneous tissue  until the platysma was identified. The platysma was then divided and undermined. The sternocleidomastoid muscle was then identified and, utilizing natural fascial planes in the neck, the prevertebral fascia was identified and the carotid sheath was retracted laterally and the trachea and esophagus retracted medially. Again using fluoroscopy, the C5-6 disc space was identified. Bovie electrocautery was used to dissect in the subperiosteal plane and elevate the bilateral longus coli muscles. Self-retaining retractors were then placed. Caspar distraction pins were placed in the adjacent bodies to allow for gentle distraction and placed under C arm guidance to allow for parallel distraction.  At this point, the microscope was draped and brought into the field, and the remainder of the case was done under the microscope using microdissecting technique.  The disc space was incised sharply and combination of high speed drill, curettes, and rongeurs were use to initially complete a discectomy. The high-speed drill was then used to complete discectomy until the posterior annulus was identified and removed and the posterior longitudinal ligament was identified. Using a nerve hook, the PLL was elevated, and Kerrison rongeurs were used to remove the posterior longitudinal ligament and the ventral thecal sac was identified. Using a combination of curettes and rongeurs, complete decompression of the thecal sac and exiting nerve roots at this level was completed, and verified with easy passage of micro-nerve hook centrally and in the bilateral foramina.  Having completed our decompression, attention was turned to placement of the intervertebral device.  The endplates were prepared with removal of cartilage, and the inferior endplate was flattened with a high-speed drill to allow good fit with the implant.  Small amount of subchondral bleeding was controlled with bone wax and Floseal.  Trial spacers were used to select a size  13 x 15 x 5 mm artificial  disc.  The disc was inserted and malleted into place, with x-rays confirming good midline interbody positioning.  Slight compression was then used on the Caspar pins to help seat the implant better.  The Caspar pins were then removed with bone wax placed in the holes.  Meticulous hemostasis was obtained.  The platysma was closed with 3-0 Vicryl stitches and the skin was closed with 4 Monocryl in subcuticular manner followed by Mastisol and Steri-Strips.  A sterile dressing was placed.  Patient was then extubated by the anesthesia service.  All counts were correct at the end of surgery.  No complications were noted.

## 2020-11-08 NOTE — Discharge Instructions (Addendum)
Can remove transparent dressing and shower 48 hours after surgery Walk as much as possible No heavy lifting >10 lbs No excessive bending or twisting of the neck or prolonged overhead activity with arms.

## 2020-11-09 ENCOUNTER — Encounter (HOSPITAL_COMMUNITY): Payer: Self-pay | Admitting: Neurosurgery

## 2021-09-30 IMAGING — RF DG C-ARM 1-60 MIN
1 series · 2 of 2 positions shown · non-contrast
Comparison: Cervical spine MRI 07/12/2020.

CLINICAL DATA: Surgery, elective. Additional history provided:
Artificial disc replacement-cervical 5-cervical 6. Provided
fluoroscopy time 28 seconds (2.28 mGy).

EXAM:
CERVICAL SPINE - 2-3 VIEW; DG C-ARM 1-60 MIN

[Series 1: run · 2 of 2 slices shown]
[im 1/2]
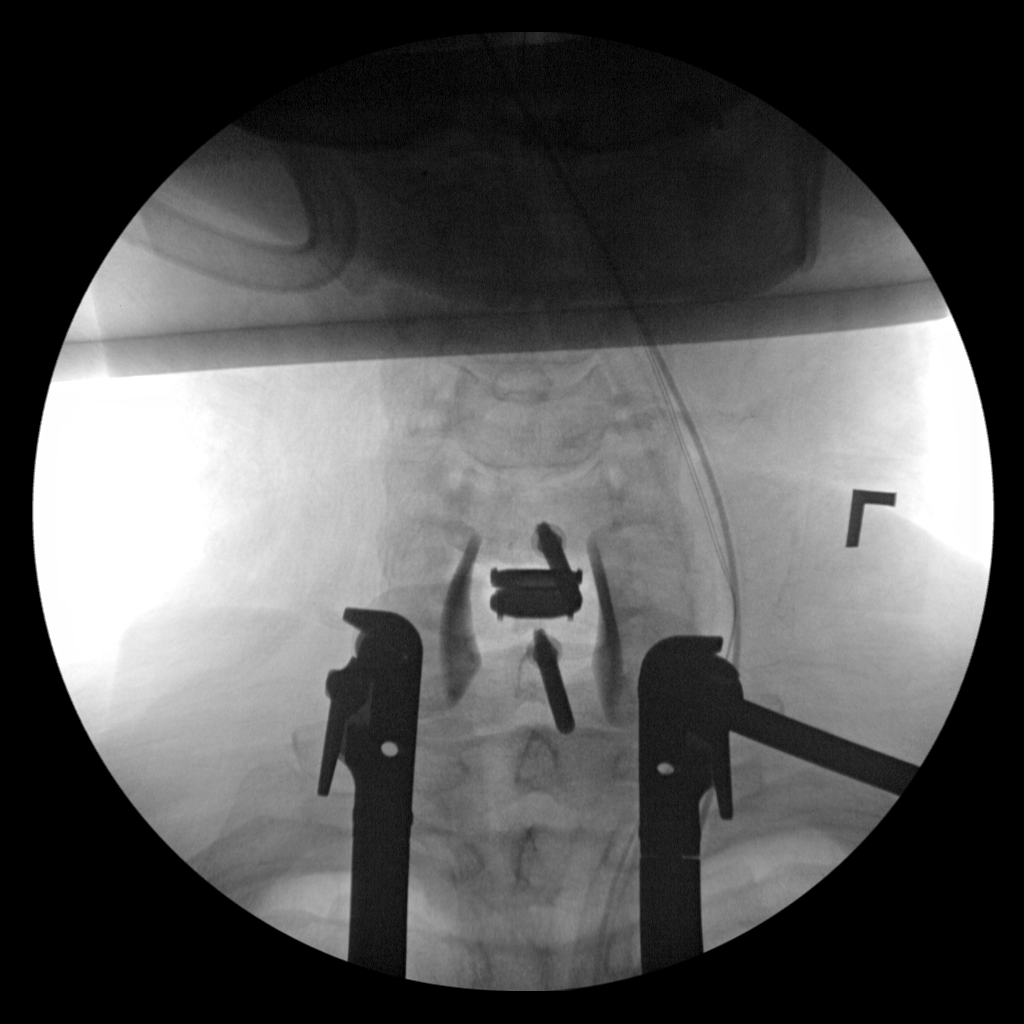
[im 2/2]
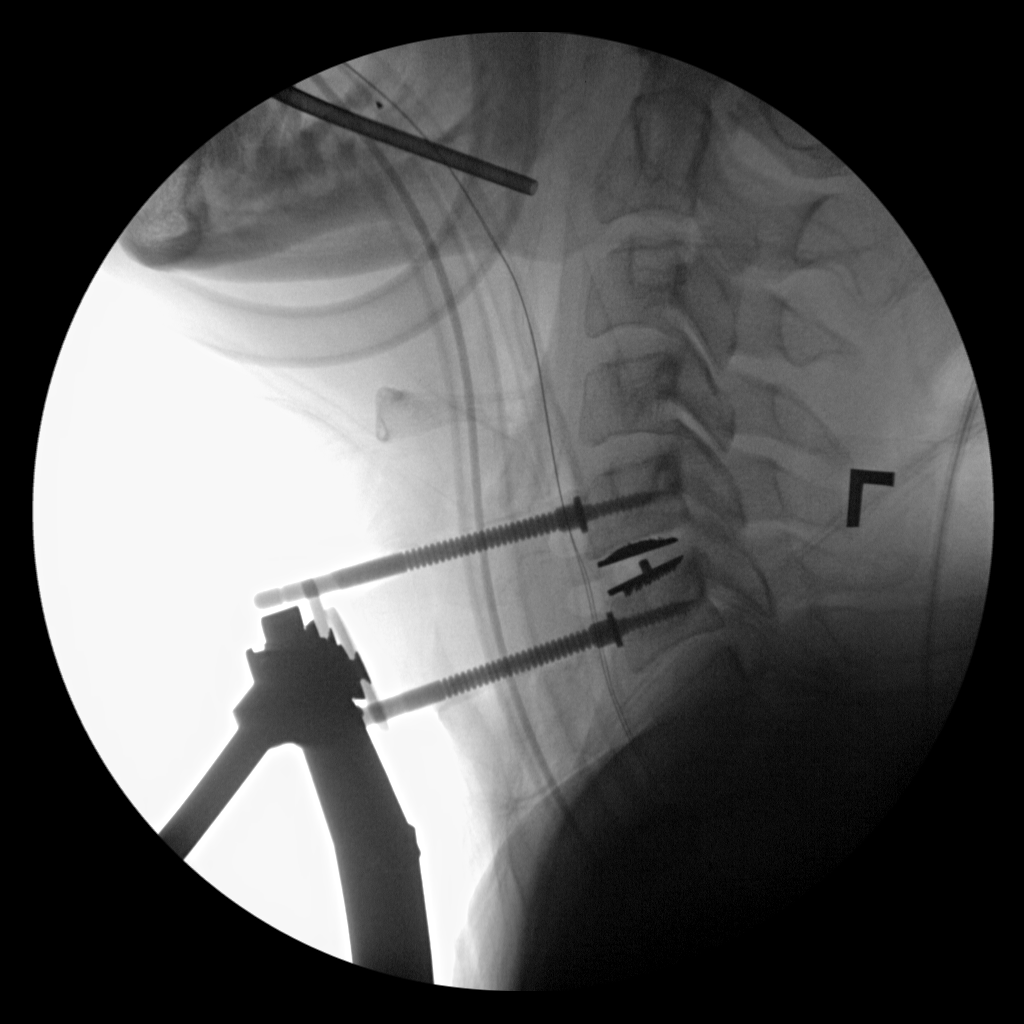

[2 of 2 positions shown; findings below may reference images not displayed]

FINDINGS: PA and lateral view intraoperative fluoroscopic images of the
cervical spine are submitted, 2 images total. On the provided
images, a disc prosthesis is present within the C5-C6 disc space.
There are metallic screws from an anterior approach within the C5
and C6 vertebral bodies. Overlying retractors. Partially visualized
support tubes.
IMPRESSION: Two intraoperative fluoroscopic images from C5-C6 disc arthroplasty,
as described.
